# Patient Record
Sex: Female | Born: 1950 | Race: White | Hispanic: No | Marital: Single | Smoking: Current every day smoker
Health system: Southern US, Community
[De-identification: ages and names within clinical notes are randomized; demographics above are authoritative.]

## PROBLEM LIST (undated history)

## (undated) HISTORY — PX: ABDOMINAL HYSTERECTOMY: SHX81

## (undated) HISTORY — PX: CHOLECYSTECTOMY: SHX55

---

## 2008-01-21 ENCOUNTER — Emergency Department: Payer: Self-pay | Admitting: Emergency Medicine

## 2009-06-09 ENCOUNTER — Emergency Department (HOSPITAL_COMMUNITY): Admission: EM | Admit: 2009-06-09 | Discharge: 2009-06-09 | Payer: Self-pay | Admitting: Emergency Medicine

## 2010-08-23 IMAGING — CR DG WRIST COMPLETE 3+V*L*
4 series · 4 of 4 positions shown · non-contrast
Comparison: None

CLINICAL DATA: Trauma.

LEFT WRIST - COMPLETE 3+ VIEW

[x wrist pa left]
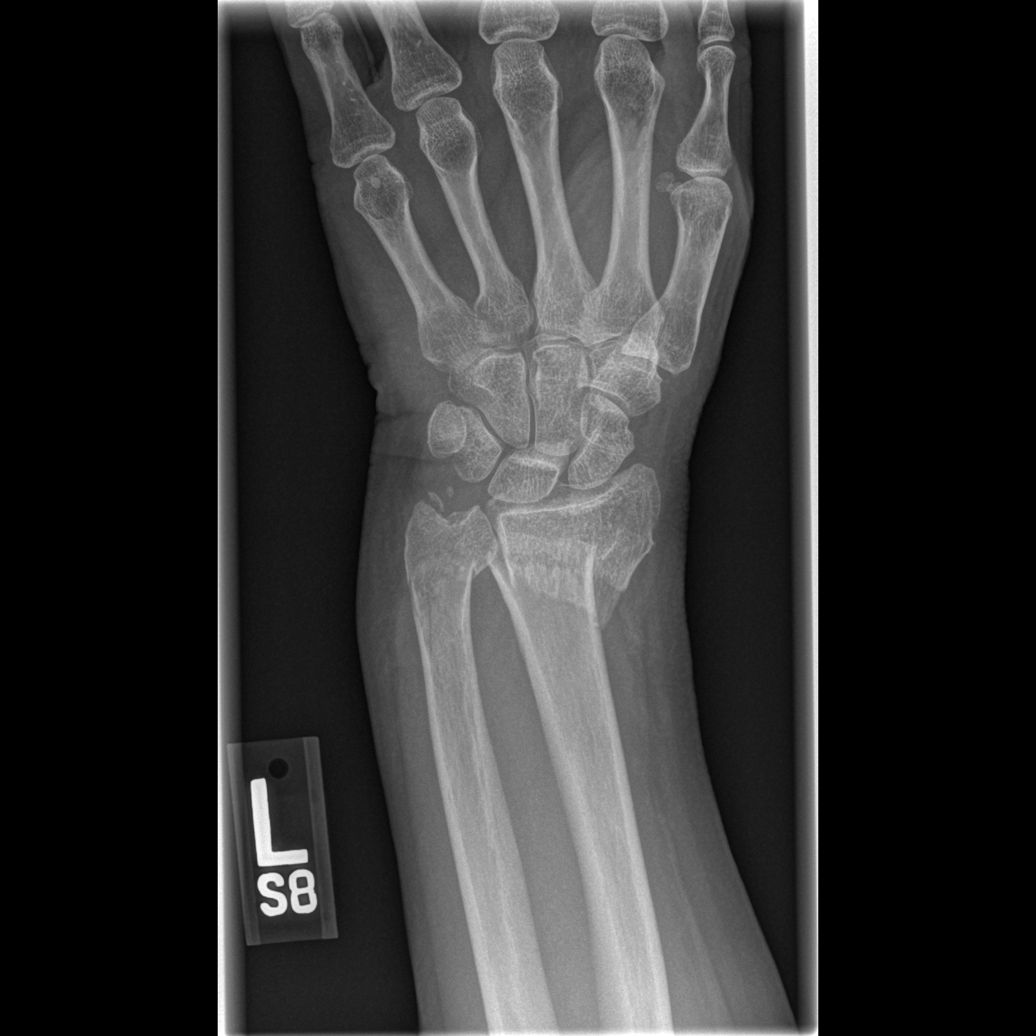

[x wrist obl left]
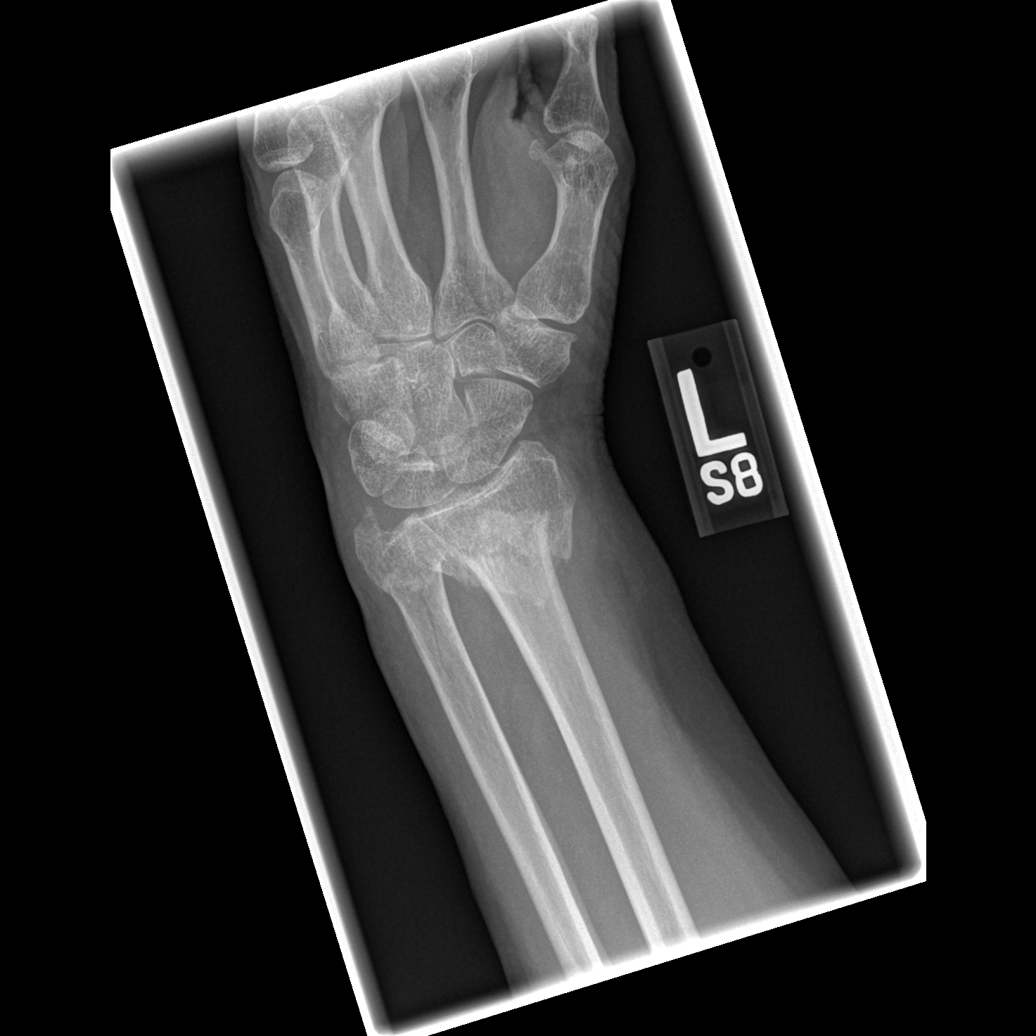

[x wrist lat left]
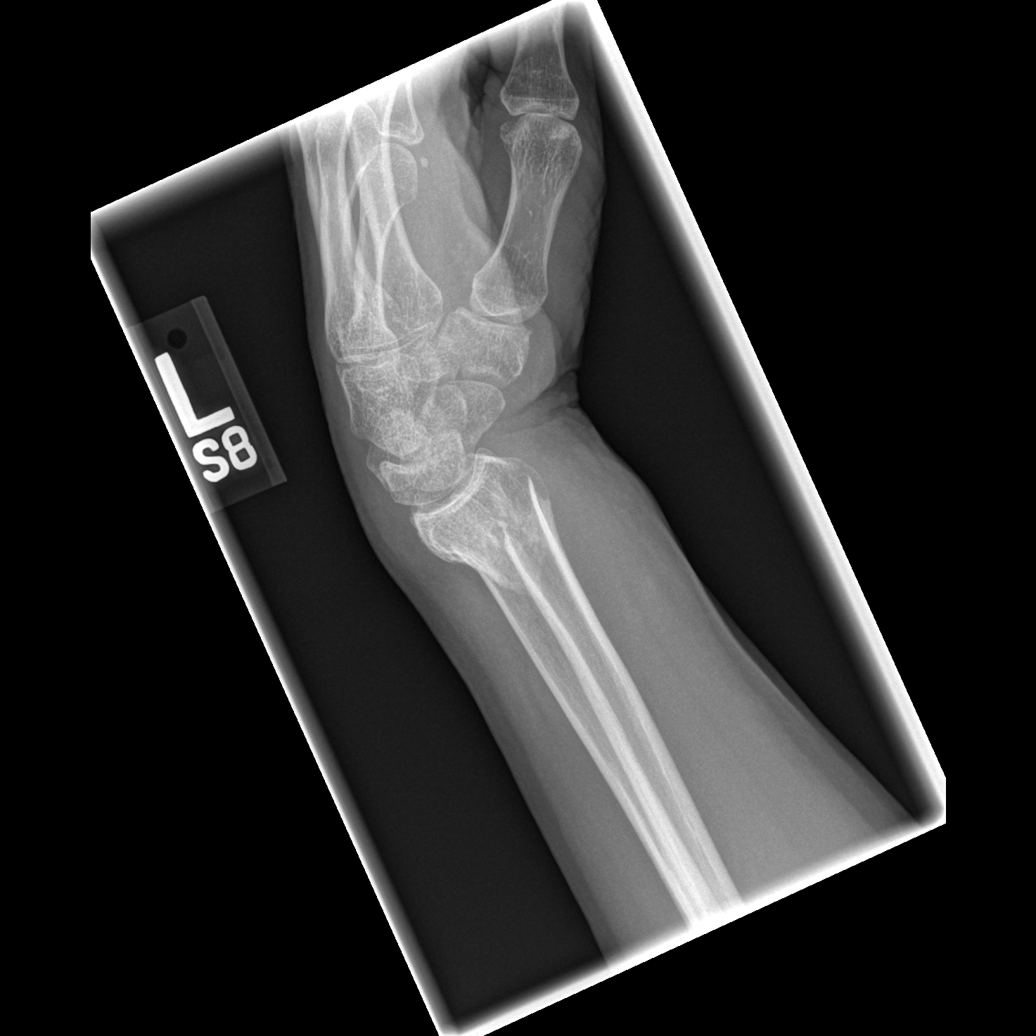

[x navicular]
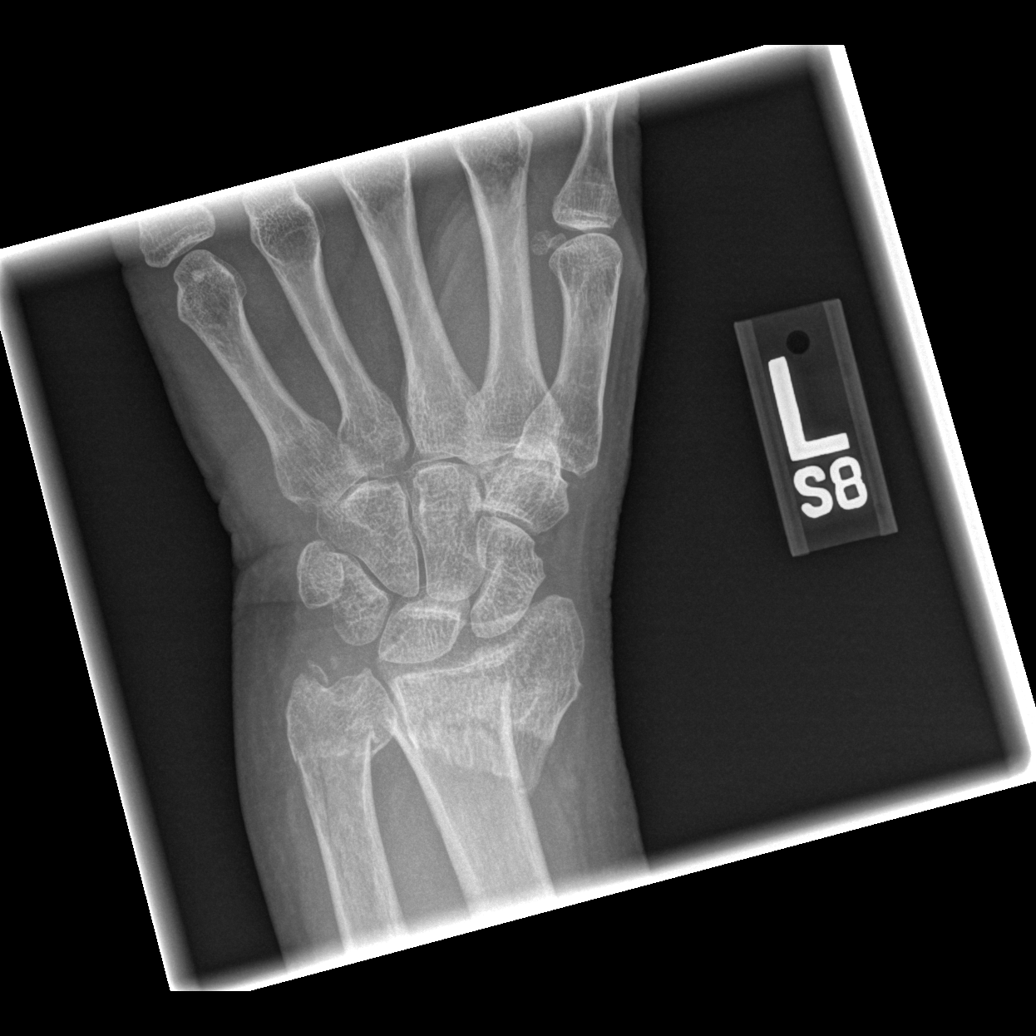

[4 of 4 positions shown; findings below may reference images not displayed]

FINDINGS: Osteopenia.  Mildly dorsally angulated impacted distal
radius fracture.  Probable mild comminution.  No definite extension
into the radiocarpal joint.  Fracture does extend to the distal
radial ulnar joint.  Comminuted distal ulnar fracture.
IMPRESSION: Distal radius and ulnar fractures as described.

## 2011-02-26 LAB — BASIC METABOLIC PANEL
BUN: 12 mg/dL (ref 6–23)
Calcium: 8.7 mg/dL (ref 8.4–10.5)
Creatinine, Ser: 0.63 mg/dL (ref 0.4–1.2)
GFR calc non Af Amer: >60 mL/min (ref >60)

## 2011-02-26 LAB — DIFFERENTIAL
Eosinophils Absolute: 0.1 10*3/uL (ref 0.0–0.7)
Lymphocytes Relative: 13 % (ref 12–46)
Lymphs Abs: 2.6 10*3/uL (ref 0.7–4.0)
Neutrophils Relative %: 81 % — ABNORMAL HIGH (ref 43–77)

## 2011-02-26 LAB — CBC
MCV: 94.3 fL (ref 78.0–100.0)
Platelets: 325 10*3/uL (ref 150–400)
WBC: 19.2 10*3/uL — ABNORMAL HIGH (ref 4.0–10.5)

## 2018-09-20 ENCOUNTER — Emergency Department (HOSPITAL_COMMUNITY): Payer: Medicare Other

## 2018-09-20 ENCOUNTER — Encounter (HOSPITAL_COMMUNITY): Payer: Self-pay | Admitting: Emergency Medicine

## 2018-09-20 ENCOUNTER — Emergency Department (HOSPITAL_COMMUNITY)
Admission: EM | Admit: 2018-09-20 | Discharge: 2018-09-20 | Disposition: A | Discharge status: Home / Self Care | Payer: Medicare Other | Attending: Emergency Medicine | Admitting: Emergency Medicine

## 2018-09-20 DIAGNOSIS — F17210 Nicotine dependence, cigarettes, uncomplicated: Secondary | ICD-10-CM | POA: Insufficient documentation

## 2018-09-20 DIAGNOSIS — S52501A Unspecified fracture of the lower end of right radius, initial encounter for closed fracture: Secondary | ICD-10-CM

## 2018-09-20 DIAGNOSIS — Y939 Activity, unspecified: Secondary | ICD-10-CM | POA: Insufficient documentation

## 2018-09-20 DIAGNOSIS — Y929 Unspecified place or not applicable: Secondary | ICD-10-CM | POA: Insufficient documentation

## 2018-09-20 DIAGNOSIS — W19XXXA Unspecified fall, initial encounter: Secondary | ICD-10-CM | POA: Insufficient documentation

## 2018-09-20 DIAGNOSIS — Y999 Unspecified external cause status: Secondary | ICD-10-CM | POA: Diagnosis not present

## 2018-09-20 MED ORDER — HYDROCODONE-ACETAMINOPHEN 5-325 MG PO TABS: 1.0000 | ORAL_TABLET | ORAL | 0 refills | Status: AC | PRN

## 2018-09-20 NOTE — ED Notes (Signed)
Patient verbalizes understanding of discharge instructions. Opportunity for questioning and answers were provided. Ambulatory at discharge in NAD.  

## 2018-09-20 NOTE — Discharge Instructions (Signed)
For severe pain take norco or vicodin however realize they have the potential for addiction and it can make you sleepy and has tylenol in it.  No operating machinery while taking. Use ice and Tylenol as needed for pain for severe pain you can take Norco.  Follow-up with orthopedics in the clinic.  Wear splint until you see orthopedics.

## 2018-09-20 NOTE — ED Triage Notes (Signed)
Pt reports tripping and falling today injuring her right wrist. Pulse is palpable. Deformity is present. Denies pain unless moved.

## 2018-09-20 NOTE — ED Provider Notes (Signed)
[facility] EMERGENCY DEPARTMENT Provider Note   CSN: [id number] Arrival date & time: 09/20/18  1052     History   Chief Complaint Chief Complaint  Patient presents with  . Fall  . Wrist Pain    HPI Chelsey Fuller is a 67 y.o. female.  Patient presents after mechanical fall tripping on a tree stump landing on her right hand and wrist.  Patient has pain with any range of motion mild swelling.  No other injuries.     History reviewed. No pertinent past medical history.  There are no active problems to display for this patient.   Past Surgical History:  Procedure Laterality Date  . ABDOMINAL HYSTERECTOMY    . CHOLECYSTECTOMY       OB History   None      Home Medications    Prior to Admission medications   Medication Sig Start Date End Date Taking? Authorizing Provider  HYDROcodone-acetaminophen (NORCO) 5-325 MG tablet Take 1-2 tablets by mouth every 4 (four) hours as needed. 09/20/18   Blane Ohara, MD    Family History No family history on file.  Social History Social History   Tobacco Use  . Smoking status: Current Every Day Smoker    Packs/day: 1.00    Types: Cigarettes  . Smokeless tobacco: Never Used  Substance Use Topics  . Alcohol use: Not Currently  . Drug use: Not on file     Allergies   Patient has no known allergies.   Review of Systems Review of Systems  Constitutional: Negative for chills and fever.  HENT: Negative for congestion.   Eyes: Negative for visual disturbance.  Respiratory: Negative for shortness of breath.   Cardiovascular: Negative for chest pain.  Gastrointestinal: Negative for abdominal pain and vomiting.  Genitourinary: Negative for dysuria and flank pain.  Musculoskeletal: Positive for joint swelling. Negative for back pain, neck pain and neck stiffness.  Skin: Negative for rash.  Neurological: Negative for light-headedness and headaches.     Physical Exam Updated Vital Signs BP  117/66   Pulse 70   Temp (!) 97.5 F (36.4 C) (Oral)   Resp 16   SpO2 100%   Physical Exam  Constitutional: She appears well-developed and well-nourished.  HENT:  Head: Normocephalic.  Neck: Normal range of motion.  Cardiovascular: Normal rate.  Pulmonary/Chest: Effort normal.  Musculoskeletal: She exhibits edema and tenderness.  Patient has tenderness mild edema distal radius on the right wrist.  Neurovascularly intact.  Patient can flex and extend fingers without significant difficulty.  No elbow or proximal forearm tenderness.  Neurological: She is alert.  Skin: Skin is warm.  Psychiatric: She has a normal mood and affect.  Nursing note and vitals reviewed.    ED Treatments / Results  Labs (all labs ordered are listed, but only abnormal results are displayed) Labs Reviewed - No data to display  EKG None  Radiology Dg Wrist Complete Right  Result Date: 09/20/2018 CLINICAL DATA:  67 year old female status post fall with wrist pain and deformity. EXAM: RIGHT WRIST - COMPLETE 3+ VIEW COMPARISON:  None. FINDINGS: Comminuted and impacted distal right radius metadiaphysis fracture with DRU and radiocarpal joint involvement. Dorsal impaction. The distal right ulna appears intact. Carpal bones appear intact, carpal joint spaces and alignment preserved. Proximal metacarpals appear intact. Soft tissue swelling IMPRESSION: Comminuted and dorsally impacted distal right radius metadiaphysis fracture with DRU and radiocarpal joint involvement. Electronically Signed   By: Odessa Fleming M.D.   On: 09/20/2018  12:05    Procedures Procedures (including critical care time)  Medications Ordered in ED Medications - No data to display   Initial Impression / Assessment and Plan / ED Course  I have reviewed the triage vital signs and the nursing notes.  Pertinent labs & imaging results that were available during my care of the patient were reviewed by me and considered in my medical decision making  (see chart for details).    Patient presents with isolated injury, x-ray reviewed consistent with comminuted distal radius fracture.  Reviewed with orthopedics and plan for splint and outpatient follow-up in the clinic.  Patient declines pain meds at this time. Splint placed in ED by technician.  Results and differential diagnosis were discussed with the patient/parent/guardian. Xrays were independently reviewed by myself.  Close follow up outpatient was discussed, comfortable with the plan.   Medications - No data to display  Vitals:   09/20/18 1115 09/20/18 1302  BP: (!) 142/45 117/66  Pulse: 72 70  Resp: 16   Temp: (!) 97.5 F (36.4 C)   TempSrc: Oral   SpO2: 95% 100%    Final diagnoses:  Closed fracture of distal end of right radius, unspecified fracture morphology, initial encounter    Final Clinical Impressions(s) / ED Diagnoses   Final diagnoses:  Closed fracture of distal end of right radius, unspecified fracture morphology, initial encounter    ED Discharge Orders         Ordered    HYDROcodone-acetaminophen (NORCO) 5-325 MG tablet  Every 4 hours PRN     09/20/18 1327           Blane Ohara, MD 09/20/18 1328

## 2018-09-20 NOTE — ED Notes (Signed)
Paged ortho regarding short arm splint

## 2019-12-04 IMAGING — CR DG WRIST COMPLETE 3+V*R*
4 series · 4 of 4 positions shown · non-contrast
Comparison: None.

CLINICAL DATA: 67-year-old female status post fall with wrist pain
and deformity.

EXAM:
RIGHT WRIST - COMPLETE 3+ VIEW

[wrist pa]
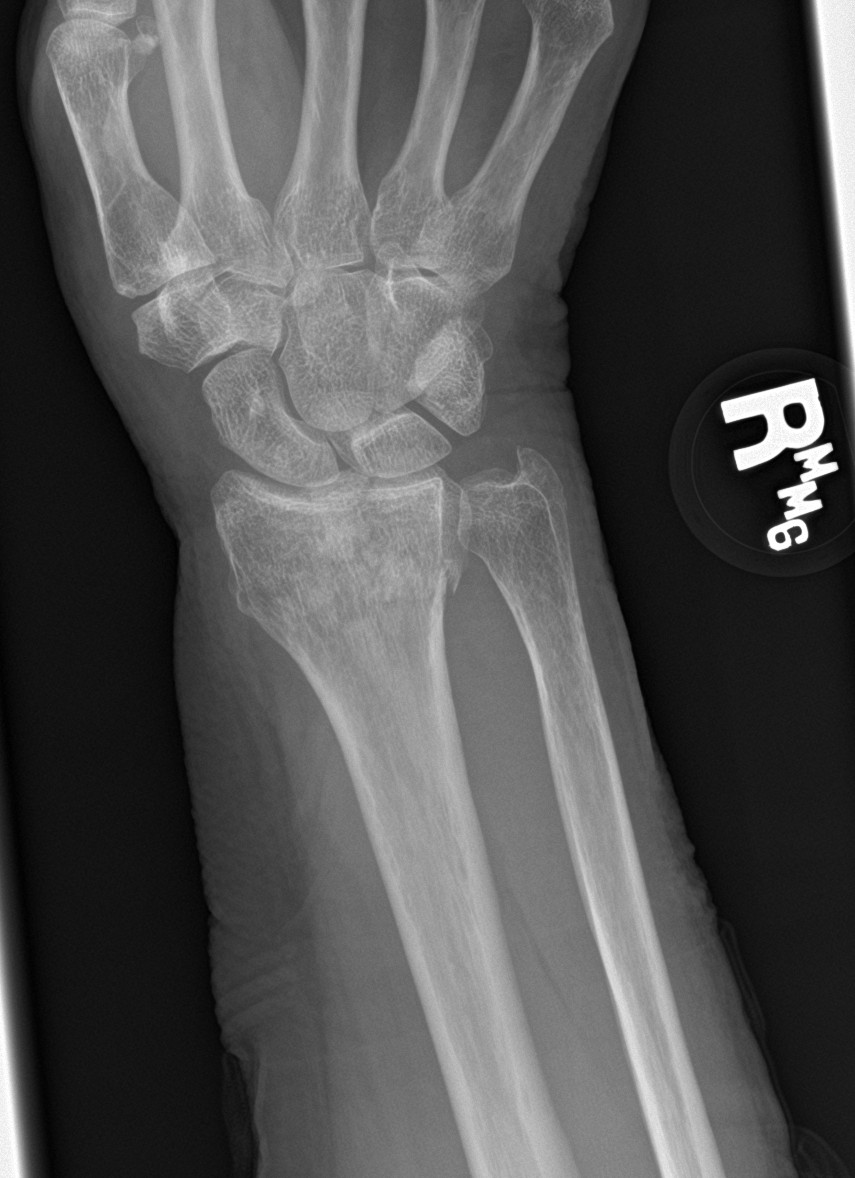

[wrist obl]
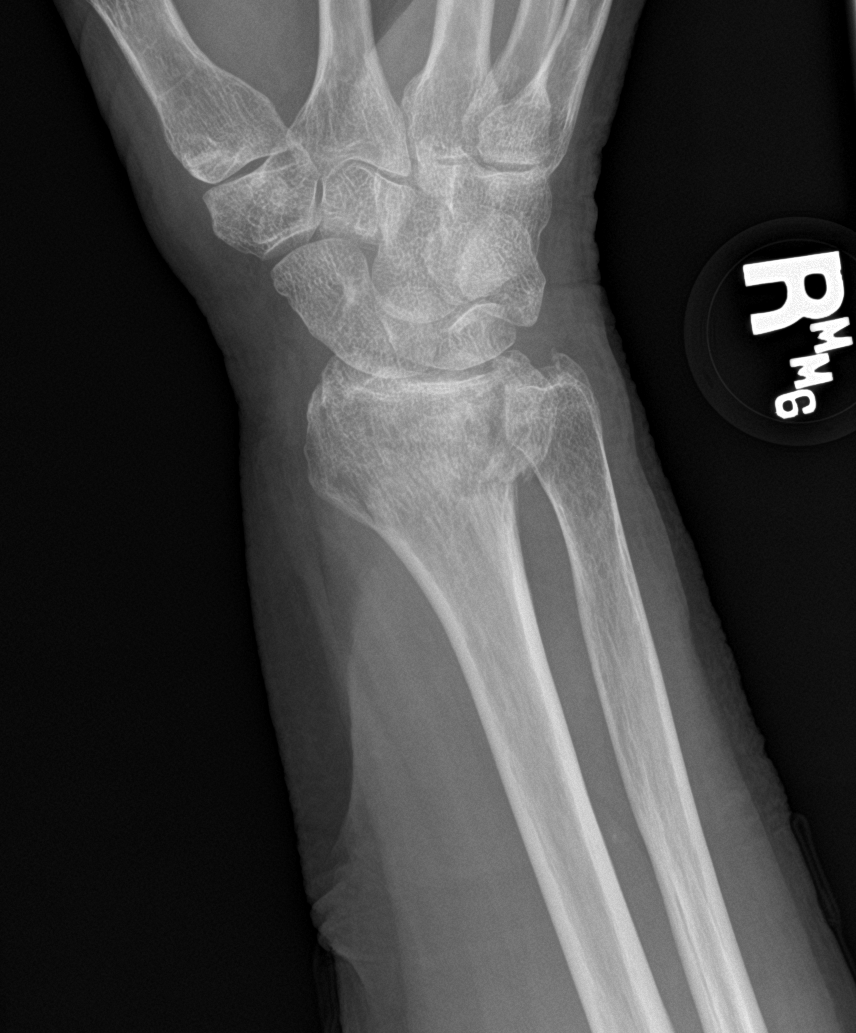

[wrist lat]
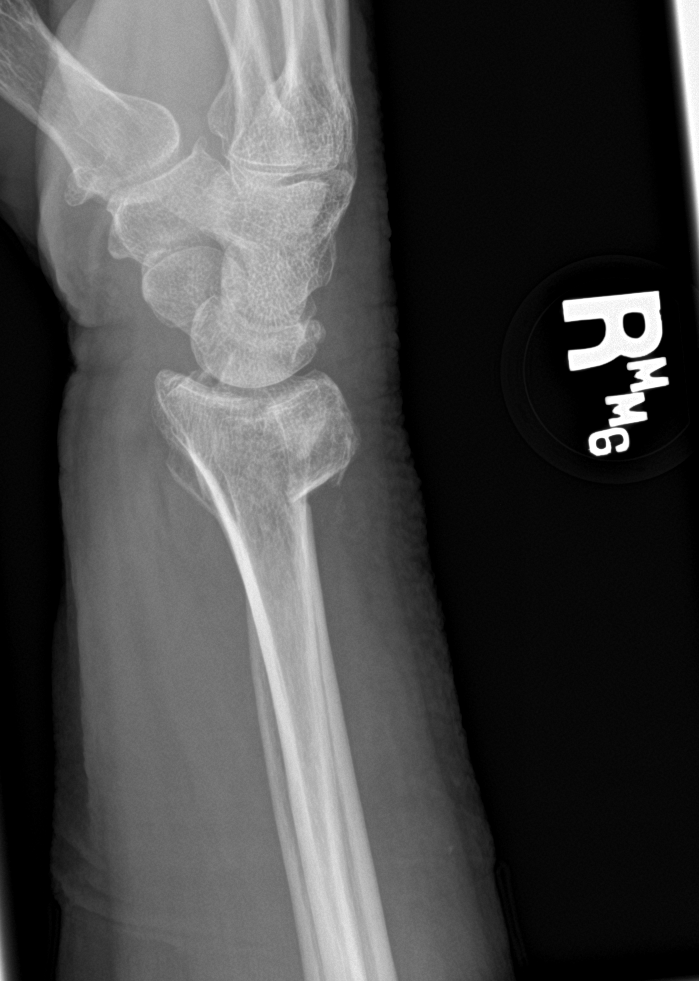

[wrist navicular]
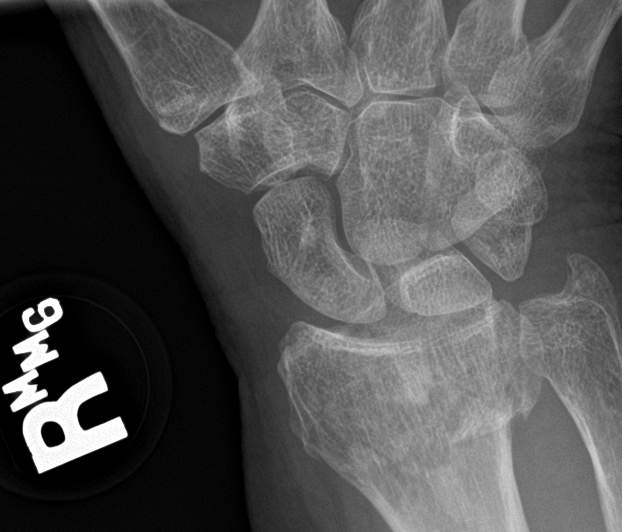

[4 of 4 positions shown; findings below may reference images not displayed]

FINDINGS: Comminuted and impacted distal right radius metadiaphysis fracture
with PRINCE D and radiocarpal joint involvement. Dorsal impaction.

The distal right ulna appears intact. Carpal bones appear intact,
carpal joint spaces and alignment preserved. Proximal metacarpals
appear intact.

Soft tissue swelling
IMPRESSION: Comminuted and dorsally impacted distal right radius metadiaphysis
fracture with PRINCE D and radiocarpal joint involvement.
